# Patient Record
Sex: Female | Born: 1980 | Race: White | Hispanic: No | Marital: Married | Smoking: Never smoker
Health system: Southern US, Community
[De-identification: ages and names within clinical notes are randomized; demographics above are authoritative.]

## PROBLEM LIST (undated history)

## (undated) DIAGNOSIS — Z789 Other specified health status: Secondary | ICD-10-CM

## (undated) DIAGNOSIS — E119 Type 2 diabetes mellitus without complications: Secondary | ICD-10-CM

## (undated) DIAGNOSIS — D62 Acute posthemorrhagic anemia: Secondary | ICD-10-CM

## (undated) HISTORY — DX: Type 2 diabetes mellitus without complications: E11.9

## (undated) HISTORY — PX: WISDOM TOOTH EXTRACTION: SHX21

## (undated) HISTORY — PX: TOTAL HIP ARTHROPLASTY: SHX124

## (undated) HISTORY — PX: REFRACTIVE SURGERY: SHX103

## (undated) HISTORY — PX: HAND SURGERY: SHX662

---

## 2010-03-11 ENCOUNTER — Ambulatory Visit (HOSPITAL_BASED_OUTPATIENT_CLINIC_OR_DEPARTMENT_OTHER): Admission: RE | Admit: 2010-03-11 | Discharge: 2010-03-11 | Payer: Self-pay | Admitting: Orthopedic Surgery

## 2012-06-27 NOTE — L&D Delivery Note (Signed)
Delivery Note After a protracted 1st stage of labor patient with OP position that turned OA with exaggerated Sims and pushing in stage 2 in lateral position, at 2:35 AM a viable and healthy female was delivered via Vaginal, Spontaneous Delivery (Presentation: ; Occiput Anterior).  APGAR: 9, 9; weight . pending  Placenta status: Intact, Spontaneous but took longer than 30 minutes to separate.  Cord: 3 vessels with the following complications: None.  Cord pH: N/A  Anesthesia: Epidural  Episiotomy: None Lacerations: Periurethral Suture Repair: 3.0 vicryl rapide Est. Blood Loss (mL): 350 cc but lower segment boggy (with prior PPHage hx) Cytotec was placed rectally.   Mom to postpartum.  Baby to nursery-stable.  Sangita Zani R 04/02/2013, 3:36 AM

## 2012-09-05 LAB — OB RESULTS CONSOLE RPR: RPR: NONREACTIVE

## 2012-09-05 LAB — OB RESULTS CONSOLE HIV ANTIBODY (ROUTINE TESTING): HIV: NONREACTIVE

## 2012-09-05 LAB — OB RESULTS CONSOLE ABO/RH: RH Type: POSITIVE

## 2012-09-18 LAB — OB RESULTS CONSOLE GC/CHLAMYDIA
Chlamydia: NEGATIVE
Gonorrhea: NEGATIVE

## 2013-03-13 LAB — OB RESULTS CONSOLE GBS: GBS: NEGATIVE

## 2013-04-01 ENCOUNTER — Encounter (HOSPITAL_COMMUNITY): Payer: Self-pay | Admitting: Anesthesiology

## 2013-04-01 ENCOUNTER — Inpatient Hospital Stay (HOSPITAL_COMMUNITY): Payer: 59 | Admitting: Anesthesiology

## 2013-04-01 ENCOUNTER — Inpatient Hospital Stay (HOSPITAL_COMMUNITY)
Admission: AD | Admit: 2013-04-01 | Discharge: 2013-04-03 | DRG: 775 | Disposition: A | Discharge status: Home / Self Care | Payer: 59 | Source: Ambulatory Visit | Attending: Obstetrics & Gynecology | Admitting: Obstetrics & Gynecology

## 2013-04-01 ENCOUNTER — Encounter (HOSPITAL_COMMUNITY): Payer: Self-pay | Admitting: *Deleted

## 2013-04-01 DIAGNOSIS — D62 Acute posthemorrhagic anemia: Secondary | ICD-10-CM

## 2013-04-01 DIAGNOSIS — IMO0001 Reserved for inherently not codable concepts without codable children: Secondary | ICD-10-CM

## 2013-04-01 DIAGNOSIS — O9903 Anemia complicating the puerperium: Secondary | ICD-10-CM | POA: Diagnosis not present

## 2013-04-01 HISTORY — DX: Other specified health status: Z78.9

## 2013-04-01 HISTORY — DX: Acute posthemorrhagic anemia: D62

## 2013-04-01 LAB — CBC
MCH: 26.3 pg (ref 26.0–34.0)
MCV: 80.7 fL (ref 78.0–100.0)
Platelets: 343 10*3/uL (ref 150–400)
RBC: 4.3 MIL/uL (ref 3.87–5.11)
RDW: 14.6 % (ref 11.5–15.5)
WBC: 10.6 10*3/uL — ABNORMAL HIGH (ref 4.0–10.5)

## 2013-04-01 MED ORDER — ACETAMINOPHEN 325 MG PO TABS: 650.0000 mg | ORAL_TABLET | ORAL | Status: DC | PRN

## 2013-04-01 MED ORDER — LACTATED RINGERS IV SOLN
INTRAVENOUS | Status: DC
  Administered 2013-04-02: 125 mL/h via INTRAVENOUS

## 2013-04-01 MED ORDER — LACTATED RINGERS IV SOLN: 500.0000 mL | INTRAVENOUS | Status: DC | PRN

## 2013-04-01 MED ORDER — EPHEDRINE 5 MG/ML INJ
10.0000 mg | INTRAVENOUS | Status: DC | PRN
  Filled 2013-04-01: qty 4
  Filled 2013-04-01: qty 2

## 2013-04-01 MED ORDER — CITRIC ACID-SODIUM CITRATE 334-500 MG/5ML PO SOLN: 30.0000 mL | ORAL | Status: DC | PRN

## 2013-04-01 MED ORDER — BUTORPHANOL TARTRATE 1 MG/ML IJ SOLN: 1.0000 mg | INTRAMUSCULAR | Status: DC | PRN

## 2013-04-01 MED ORDER — IBUPROFEN 600 MG PO TABS: 600.0000 mg | ORAL_TABLET | Freq: Four times a day (QID) | ORAL | Status: DC | PRN

## 2013-04-01 MED ORDER — BUTORPHANOL TARTRATE 1 MG/ML IJ SOLN
INTRAMUSCULAR | Status: AC
  Administered 2013-04-01: 1 mg
  Filled 2013-04-01: qty 1

## 2013-04-01 MED ORDER — ONDANSETRON HCL 4 MG/2ML IJ SOLN: 4.0000 mg | Freq: Four times a day (QID) | INTRAMUSCULAR | Status: DC | PRN

## 2013-04-01 MED ORDER — PHENYLEPHRINE 40 MCG/ML (10ML) SYRINGE FOR IV PUSH (FOR BLOOD PRESSURE SUPPORT)
80.0000 ug | PREFILLED_SYRINGE | INTRAVENOUS | Status: DC | PRN
  Filled 2013-04-01: qty 2

## 2013-04-01 MED ORDER — OXYTOCIN 40 UNITS IN LACTATED RINGERS INFUSION - SIMPLE MED
62.5000 mL/h | INTRAVENOUS | Status: DC
  Filled 2013-04-01: qty 1000

## 2013-04-01 MED ORDER — DIPHENHYDRAMINE HCL 50 MG/ML IJ SOLN: 12.5000 mg | INTRAMUSCULAR | Status: DC | PRN

## 2013-04-01 MED ORDER — OXYTOCIN BOLUS FROM INFUSION
500.0000 mL | INTRAVENOUS | Status: DC
  Administered 2013-04-02: 500 mL via INTRAVENOUS

## 2013-04-01 MED ORDER — EPHEDRINE 5 MG/ML INJ
10.0000 mg | INTRAVENOUS | Status: DC | PRN
  Filled 2013-04-01: qty 2

## 2013-04-01 MED ORDER — OXYCODONE-ACETAMINOPHEN 5-325 MG PO TABS: 1.0000 | ORAL_TABLET | ORAL | Status: DC | PRN

## 2013-04-01 MED ORDER — FENTANYL 2.5 MCG/ML BUPIVACAINE 1/10 % EPIDURAL INFUSION (WH - ANES)
14.0000 mL/h | INTRAMUSCULAR | Status: DC | PRN
  Administered 2013-04-01 (×2): 14 mL/h via EPIDURAL
  Filled 2013-04-01: qty 125

## 2013-04-01 MED ORDER — LACTATED RINGERS IV SOLN
500.0000 mL | Freq: Once | INTRAVENOUS | Status: AC
  Administered 2013-04-01: 500 mL via INTRAVENOUS

## 2013-04-01 MED ORDER — LIDOCAINE HCL (PF) 1 % IJ SOLN
30.0000 mL | INTRAMUSCULAR | Status: DC | PRN
  Administered 2013-04-02: 30 mL via SUBCUTANEOUS
  Filled 2013-04-01 (×2): qty 30

## 2013-04-01 MED ORDER — LIDOCAINE HCL (PF) 1 % IJ SOLN
INTRAMUSCULAR | Status: DC | PRN
  Administered 2013-04-01 (×2): 5 mL

## 2013-04-01 MED ORDER — PHENYLEPHRINE 40 MCG/ML (10ML) SYRINGE FOR IV PUSH (FOR BLOOD PRESSURE SUPPORT)
80.0000 ug | PREFILLED_SYRINGE | INTRAVENOUS | Status: DC | PRN
  Filled 2013-04-01: qty 2
  Filled 2013-04-01: qty 5

## 2013-04-01 NOTE — Anesthesia Procedure Notes (Signed)
Epidural Patient location during procedure: OB Start time: 04/01/2013 9:54 PM  Staffing Anesthesiologist: Angus Seller., Harrell Gave. Performed by: anesthesiologist   Preanesthetic Checklist Completed: patient identified, site marked, surgical consent, pre-op evaluation, timeout performed, IV checked, risks and benefits discussed and monitors and equipment checked  Epidural Patient position: sitting Prep: site prepped and draped and DuraPrep Patient monitoring: continuous pulse ox and blood pressure Approach: midline Injection technique: LOR air and LOR saline  Needle:  Needle type: Tuohy  Needle gauge: 17 G Needle length: 9 cm and 9 Needle insertion depth: 5 cm cm Catheter type: closed end flexible Catheter size: 19 Gauge Catheter at skin depth: 10 cm Test dose: negative  Assessment Events: blood not aspirated, injection not painful, no injection resistance, negative IV test and no paresthesia  Additional Notes Patient identified.  Risk benefits discussed including failed block, incomplete pain control, headache, nerve damage, paralysis, blood pressure changes, nausea, vomiting, reactions to medication both toxic or allergic, and postpartum back pain.  Patient expressed understanding and wished to proceed.  All questions were answered.  Sterile technique used throughout procedure and epidural site dressed with sterile barrier dressing. No paresthesia or other complications noted.The patient did not experience any signs of intravascular injection such as tinnitus or metallic taste in mouth nor signs of intrathecal spread such as rapid motor block. Please see nursing notes for vital signs.

## 2013-04-01 NOTE — Progress Notes (Signed)
Patient ID: Karina Pollard, female   DOB: 1980/10/24, 32 y.o.   MRN: [id number] Subjective: Requesting epidural since Stadol not helping and progress arrested at 9 cm, possible OP.   Objective: BP 102/66  Pulse 101  Temp(Src) 98.3 F (36.8 C) (Oral)  Resp 18  Ht 5\' 2"  (1.575 m)  Wt 157 lb (71.215 kg)  BMI 28.71 kg/m2  FHT:  FHR: 140 bpm, variability: moderate,  accelerations:  Present,  decelerations:  Absent UC:   regular, every 3-5 minutes spontaneous labor SVE:   Dilation: Lip/rim Effacement (%): 90 Station: 0;+1 Exam by:: [email or web address]   Assessment / Plan: Spontaneous labor, progressing normally with arrest of rotation/dilatation, give epidural, put in exaggerated Sims position and reassess progress.   Fetal Wellbeing:  Category I Pain Control:  Epidural in process  Anticipated MOD:  NSVD  [name] 04/01/2013, 10:02 PM

## 2013-04-01 NOTE — Anesthesia Preprocedure Evaluation (Signed)

## 2013-04-01 NOTE — H&P (Signed)
Karina Pollard is a 32 y.o. female presenting at 38.1 wks in active labor, direct admit from office.  No leaking/bleeding. UCs since this afternoon.  [facility], Dr Seymour Bars primary. Nl labs and anatomy sono. Uncomplicated pregnancy.  Prior term SVD at 37 wks, was OP delivery History OB History   Grav Para Term Preterm Abortions TAB SAB Ect Mult Living   2 1 1  0 0 0 0 0 0 1     Past Medical History  Diagnosis Date  . Medical history non-contributory    Past Surgical History  Procedure Laterality Date  . Wisdom tooth extraction     Family History: family history is not on file. Social History:  reports that she has never smoked. She has never used smokeless tobacco. She reports that she does not drink alcohol or use illicit drugs.   Prenatal Transfer Tool  Maternal Diabetes: No Genetic Screening: declined, but AFP1 normal  Maternal Ultrasounds/Referrals: Normal Fetal Ultrasounds or other Referrals:  None Maternal Substance Abuse:  No Significant Maternal Medications:  None Significant Maternal Lab Results:  Lab values include: Group B Strep negative Other Comments:  None  ROS neg  Dilation: 5.5 Effacement (%): 90 Exam by:: Dr. Juliene Pina Blood pressure 116/81, pulse 97, temperature 98.1 F (36.7 C), temperature source Axillary, resp. rate 20, height 5\' 2"  (1.575 m), weight 157 lb (71.215 kg). Exam Physical Exam  Physical exam:  A&O x 3, no acute distress. Pleasant HEENT neg, no thyromegaly Lungs CTA bilat CV RRR, S1S2 normal Abdo soft, non tender, non acute, gravid uterus. Relaxing in b/w UCs.  Extr no edema/ tenderness Pelvic as above. AROM, copious clear fluid, head well applied to cervix after AROM.  FHT 130s/ + accels/ no decels/ mod variab- reassuring Toco q 4 min, spontaneous  Prenatal labs: ABO, Rh: A(+) Antibody: neg Rubella: immune RPR: neg x2 HBsAg: neg HIV: neg GBS: neg 3hr GTT- one abn value  Assessment/Plan: 32 yo, G2 P1 at 38.1 wks in  active labor, EFW 7 lbs. FHT category I, GBS neg. Pt plans to defer epidural and IV pain meds, will cont to support as needed, anticipate SVD.    Deundra Bard R 04/01/2013, 4:33 PM

## 2013-04-02 ENCOUNTER — Encounter (HOSPITAL_COMMUNITY): Payer: Self-pay | Admitting: Emergency Medicine

## 2013-04-02 LAB — CBC
Hemoglobin: 9.2 g/dL — ABNORMAL LOW (ref 12.0–15.0)
MCH: 26.7 pg (ref 26.0–34.0)
MCHC: 33 g/dL (ref 30.0–36.0)
RDW: 14.5 % (ref 11.5–15.5)
WBC: 12.4 10*3/uL — ABNORMAL HIGH (ref 4.0–10.5)

## 2013-04-02 MED ORDER — MISOPROSTOL 200 MCG PO TABS
800.0000 ug | ORAL_TABLET | Freq: Once | ORAL | Status: AC
  Administered 2013-04-02: 800 ug via RECTAL

## 2013-04-02 MED ORDER — ONDANSETRON HCL 4 MG PO TABS: 4.0000 mg | ORAL_TABLET | ORAL | Status: DC | PRN

## 2013-04-02 MED ORDER — SIMETHICONE 80 MG PO CHEW: 80.0000 mg | CHEWABLE_TABLET | ORAL | Status: DC | PRN

## 2013-04-02 MED ORDER — IBUPROFEN 600 MG PO TABS
600.0000 mg | ORAL_TABLET | Freq: Four times a day (QID) | ORAL | Status: DC
  Administered 2013-04-02 – 2013-04-03 (×5): 600 mg via ORAL
  Filled 2013-04-02 (×5): qty 1

## 2013-04-02 MED ORDER — SENNOSIDES-DOCUSATE SODIUM 8.6-50 MG PO TABS
2.0000 | ORAL_TABLET | ORAL | Status: DC
  Administered 2013-04-02: 2 via ORAL

## 2013-04-02 MED ORDER — PRENATAL MULTIVITAMIN CH
1.0000 | ORAL_TABLET | Freq: Every day | ORAL | Status: DC
  Administered 2013-04-02: 1 via ORAL
  Filled 2013-04-02: qty 1

## 2013-04-02 MED ORDER — MISOPROSTOL 200 MCG PO TABS
ORAL_TABLET | ORAL | Status: AC
  Administered 2013-04-02: 800 ug via RECTAL
  Filled 2013-04-02: qty 4

## 2013-04-02 MED ORDER — TETANUS-DIPHTH-ACELL PERTUSSIS 5-2.5-18.5 LF-MCG/0.5 IM SUSP: 0.5000 mL | Freq: Once | INTRAMUSCULAR | Status: DC

## 2013-04-02 MED ORDER — LANOLIN HYDROUS EX OINT: TOPICAL_OINTMENT | CUTANEOUS | Status: DC | PRN

## 2013-04-02 MED ORDER — ONDANSETRON HCL 4 MG/2ML IJ SOLN: 4.0000 mg | INTRAMUSCULAR | Status: DC | PRN

## 2013-04-02 MED ORDER — WITCH HAZEL-GLYCERIN EX PADS: 1.0000 "application " | MEDICATED_PAD | CUTANEOUS | Status: DC | PRN

## 2013-04-02 MED ORDER — DIPHENHYDRAMINE HCL 25 MG PO CAPS: 25.0000 mg | ORAL_CAPSULE | Freq: Four times a day (QID) | ORAL | Status: DC | PRN

## 2013-04-02 MED ORDER — BENZOCAINE-MENTHOL 20-0.5 % EX AERO: 1.0000 "application " | INHALATION_SPRAY | CUTANEOUS | Status: DC | PRN

## 2013-04-02 MED ORDER — DIBUCAINE 1 % RE OINT: 1.0000 "application " | TOPICAL_OINTMENT | RECTAL | Status: DC | PRN

## 2013-04-02 MED ORDER — ZOLPIDEM TARTRATE 5 MG PO TABS: 5.0000 mg | ORAL_TABLET | Freq: Every evening | ORAL | Status: DC | PRN

## 2013-04-02 MED ORDER — OXYCODONE-ACETAMINOPHEN 5-325 MG PO TABS: 1.0000 | ORAL_TABLET | ORAL | Status: DC | PRN

## 2013-04-02 NOTE — Progress Notes (Signed)
Karina Pollard is a 32 y.o. G2P1001 at [redacted]w[redacted]d in active labor. S/p Stadol and now s/p Epidural with protracted 1st stage.   Objective: BP 117/70  Pulse 86  Temp(Src) 99 F (37.2 C) (Oral)  Resp 18  Ht 5\' 2"  (1.575 m)  Wt 157 lb (71.215 kg)  BMI 28.71 kg/m2   FHT:  FHR: 135 bpm, variability: moderate,  accelerations:  Present,  decelerations:  Absent UC:   regular, every 3 minutes per palpation per RN SVE:   Dilation: 10 Effacement (%): 90 Station: 0 Exam by:: Dr Juliene Pina Place in right exaggerated Sims, reassess in 1 hr for rotation and descent  Assessment / Plan: Protracted active phase, but now complete. Reassess in 1 hr.  Labor: Progressing normally and will continue to assess for descent Fetal Wellbeing:  Category I Pain Control:  Epidural Anticipated MOD:  NSVD  Karina Pollard R 04/02/2013, 12:29 AM

## 2013-04-02 NOTE — Anesthesia Postprocedure Evaluation (Signed)
  Anesthesia Post-op Note  Anesthesia Post Note  Patient: Karina Pollard  Procedure(s) Performed: * No procedures listed *  Anesthesia type: Epidural  Patient location: Mother/Baby  Post pain: Pain level controlled  Post assessment: Post-op Vital signs reviewed  Last Vitals:  Filed Vitals:   04/02/13 0545  BP: 110/87  Pulse: 92  Temp: 37.1 C  Resp: 20    Post vital signs: Reviewed  Level of consciousness:alert  Complications: No apparent anesthesia complications

## 2013-04-02 NOTE — Progress Notes (Signed)
Iv infiltrated, iv d/c'd, very large area of swelling at site, non-tender, and no redness

## 2013-04-03 ENCOUNTER — Encounter (HOSPITAL_COMMUNITY): Payer: Self-pay | Admitting: Obstetrics and Gynecology

## 2013-04-03 DIAGNOSIS — D62 Acute posthemorrhagic anemia: Secondary | ICD-10-CM

## 2013-04-03 HISTORY — DX: Acute posthemorrhagic anemia: D62

## 2013-04-03 MED ORDER — POLYSACCHARIDE IRON COMPLEX 150 MG PO CAPS
150.0000 mg | ORAL_CAPSULE | Freq: Two times a day (BID) | ORAL | Status: DC
  Filled 2013-04-03: qty 1

## 2013-04-03 MED ORDER — FERROUS SULFATE DRIED ER 160 (50 FE) MG PO TBCR: 1.0000 | EXTENDED_RELEASE_TABLET | Freq: Two times a day (BID) | ORAL | Status: DC

## 2013-04-03 MED ORDER — IBUPROFEN 600 MG PO TABS: 600.0000 mg | ORAL_TABLET | Freq: Four times a day (QID) | ORAL | Status: DC

## 2013-04-03 NOTE — Discharge Summary (Signed)
Obstetric Discharge Summary Reason for Admission: onset of labor Prenatal Procedures: ultrasound Intrapartum Procedures: spontaneous vaginal delivery Postpartum Procedures: none Complications-Operative and Postpartum: periurethral laceration Hemoglobin  Date Value Range Status  04/02/2013 9.2* 12.0 - 15.0 g/dL Final     DELTA CHECK NOTED     REPEATED TO VERIFY     HCT  Date Value Range Status  04/02/2013 27.9* 36.0 - 46.0 % Final    Physical Exam:  General: alert, cooperative and no distress Lochia: appropriate Uterine Fundus: firm DVT Evaluation: No evidence of DVT seen on physical exam. Negative Homan's sign. No cords or calf tenderness. No significant calf/ankle edema.  Discharge Diagnoses: Term Pregnancy-delivered        Acute Blood Loss Anemia Discharge Information: Date: 04/03/2013 Activity: pelvic rest Diet: routine Medications: PNV, Ibuprofen and Iron Condition: stable Instructions: refer to practice specific booklet Discharge to: home Follow-up Information   Follow up with LAVOIE,MARIE-LYNE, MD. Schedule an appointment as soon as possible for a visit in 6 weeks.   Specialty:  Obstetrics and Gynecology   Contact information:   [address] [place] [address] [phone]       Newborn Data: Live born female on 04/02/2013 Birth Weight: 7 lb 8.6 oz (3420 g) APGAR: 9, 9  Infant remains in NICU - possible d/c this Friday  Raelyn Mora, M, MSN, CNM 04/03/2013, 9:50 AM

## 2013-04-03 NOTE — Progress Notes (Signed)
Patient ID: Karina Pollard, female   DOB: 1980/12/26, 32 y.o.   MRN: [id number] PPD # 1 SVD  S:  Reports feeling well, desires early discharge d/t other child at home, baby possibly d/c'd this Friday             Tolerating po/ No nausea or vomiting             Bleeding is light             Pain controlled with none, Rx'd ibuprofen             Up ad lib / ambulatory / voiding without difficulties    Newborn  Information for the patient's newborn:  Karina, Pollard [[id number]]  female  bottle feeding - baby in NICU / Circumcision planning prior to infant's discharge   O:  A & O x 3, in no apparent distress, very pleasant             VS:  Filed Vitals:   04/02/13 0445 04/02/13 0545 04/02/13 1100 04/02/13 1812  BP: 116/72 110/87 111/76 122/74  Pulse: 98 92 82 95  Temp: 98.5 F (36.9 C) 98.7 F (37.1 C) 98.1 F (36.7 C) 97.7 F (36.5 C)  TempSrc: Oral Oral Oral Oral  Resp: 20 20 16 18   Height:      Weight:      SpO2: 99% 100% 98% 99%    LABS:  Recent Labs  04/01/13 1540 04/02/13 0600  WBC 10.6* 12.4*  HGB 11.3* 9.2*  HCT 34.7* 27.9*  PLT 343 258    Blood type: A/Positive/-- (03/12 0000)  Rubella: Immune (03/12 0000)     Lungs: Clear and unlabored  Heart: regular rate and rhythm / no murmurs  Abdomen: soft, non-tender, non-distended, normal bowel sounds             Fundus: firm, non-tender, U-2  Perineum: repair healing well, edema present - no ice pack in place  Lochia: light  Extremities: no edema, no calf pain or tenderness, no Homans    A/P: PPD # 1  32 y.o., [id number]   Active Problems:   Postpartum care following vaginal delivery (10/7)   Acute blood loss anemia   Doing well - stable status  Routine post partum orders  Niferex 150mg  BID x 6 weeks  Early discharge today  Karina Pollard, M, MSN, CNM 04/03/2013, 9:42 AM

## 2013-04-09 ENCOUNTER — Inpatient Hospital Stay (HOSPITAL_COMMUNITY): Admission: RE | Admit: 2013-04-09 | Payer: 59 | Source: Ambulatory Visit

## 2014-04-28 ENCOUNTER — Encounter (HOSPITAL_COMMUNITY): Payer: Self-pay | Admitting: Obstetrics and Gynecology

## 2016-08-08 ENCOUNTER — Encounter: Payer: Self-pay | Admitting: Obstetrics & Gynecology

## 2016-08-08 DIAGNOSIS — Z01419 Encounter for gynecological examination (general) (routine) without abnormal findings: Secondary | ICD-10-CM | POA: Diagnosis not present

## 2016-08-08 DIAGNOSIS — Z6824 Body mass index (BMI) 24.0-24.9, adult: Secondary | ICD-10-CM | POA: Diagnosis not present

## 2016-09-09 DIAGNOSIS — J069 Acute upper respiratory infection, unspecified: Secondary | ICD-10-CM | POA: Diagnosis not present

## 2016-11-24 DIAGNOSIS — J0100 Acute maxillary sinusitis, unspecified: Secondary | ICD-10-CM | POA: Diagnosis not present

## 2017-03-06 DIAGNOSIS — M545 Low back pain: Secondary | ICD-10-CM | POA: Diagnosis not present

## 2017-03-30 DIAGNOSIS — J0100 Acute maxillary sinusitis, unspecified: Secondary | ICD-10-CM | POA: Diagnosis not present

## 2017-05-24 DIAGNOSIS — M545 Low back pain: Secondary | ICD-10-CM | POA: Diagnosis not present

## 2017-05-24 DIAGNOSIS — S336XXA Sprain of sacroiliac joint, initial encounter: Secondary | ICD-10-CM | POA: Diagnosis not present

## 2017-06-14 DIAGNOSIS — L820 Inflamed seborrheic keratosis: Secondary | ICD-10-CM | POA: Diagnosis not present

## 2017-06-14 DIAGNOSIS — L578 Other skin changes due to chronic exposure to nonionizing radiation: Secondary | ICD-10-CM | POA: Diagnosis not present

## 2017-06-14 DIAGNOSIS — L821 Other seborrheic keratosis: Secondary | ICD-10-CM | POA: Diagnosis not present

## 2017-09-02 DIAGNOSIS — K529 Noninfective gastroenteritis and colitis, unspecified: Secondary | ICD-10-CM | POA: Diagnosis not present

## 2017-09-21 ENCOUNTER — Telehealth: Payer: Self-pay | Admitting: *Deleted

## 2017-09-21 MED ORDER — NORETHIN ACE-ETH ESTRAD-FE 1-20 MG-MCG(24) PO TABS: 1.0000 | ORAL_TABLET | Freq: Every day | ORAL | 3 refills | Status: DC

## 2017-09-21 NOTE — Telephone Encounter (Signed)
Pt has annual scheduled on 01/02/18 needs refill on birth control pills, per [facility] chart,last annual 08/08/2016,Rx for lomedia 24 FE sent.

## 2017-11-29 DIAGNOSIS — M25562 Pain in left knee: Secondary | ICD-10-CM | POA: Diagnosis not present

## 2017-12-01 DIAGNOSIS — M25562 Pain in left knee: Secondary | ICD-10-CM | POA: Diagnosis not present

## 2017-12-04 ENCOUNTER — Telehealth: Payer: Self-pay | Admitting: *Deleted

## 2017-12-04 MED ORDER — NORETHIN ACE-ETH ESTRAD-FE 1-20 MG-MCG(24) PO TABS: ORAL_TABLET | ORAL | 0 refills | Status: DC

## 2017-12-04 NOTE — Telephone Encounter (Signed)
Patient called stating her Rx that was sent on 09/21/17 was written incorrect. States she takes 1 active pill continuously skipping placebo pills. I called the pharmacy [facility] in [place] and confirmed and this was correct, new Rx will be sent with previous directions.

## 2018-01-01 DIAGNOSIS — L821 Other seborrheic keratosis: Secondary | ICD-10-CM | POA: Diagnosis not present

## 2018-01-01 DIAGNOSIS — C44519 Basal cell carcinoma of skin of other part of trunk: Secondary | ICD-10-CM | POA: Diagnosis not present

## 2018-01-01 DIAGNOSIS — L578 Other skin changes due to chronic exposure to nonionizing radiation: Secondary | ICD-10-CM | POA: Diagnosis not present

## 2018-01-01 DIAGNOSIS — L570 Actinic keratosis: Secondary | ICD-10-CM | POA: Diagnosis not present

## 2018-01-02 ENCOUNTER — Encounter: Payer: Self-pay | Admitting: Obstetrics & Gynecology

## 2018-01-29 DIAGNOSIS — C44529 Squamous cell carcinoma of skin of other part of trunk: Secondary | ICD-10-CM | POA: Diagnosis not present

## 2018-02-06 DIAGNOSIS — M25562 Pain in left knee: Secondary | ICD-10-CM | POA: Diagnosis not present

## 2018-02-08 DIAGNOSIS — M94262 Chondromalacia, left knee: Secondary | ICD-10-CM | POA: Diagnosis not present

## 2018-02-08 DIAGNOSIS — M25562 Pain in left knee: Secondary | ICD-10-CM | POA: Diagnosis not present

## 2018-02-14 DIAGNOSIS — M25562 Pain in left knee: Secondary | ICD-10-CM | POA: Diagnosis not present

## 2018-02-19 DIAGNOSIS — M1712 Unilateral primary osteoarthritis, left knee: Secondary | ICD-10-CM | POA: Diagnosis not present

## 2018-02-23 ENCOUNTER — Other Ambulatory Visit: Payer: Self-pay | Admitting: Obstetrics & Gynecology

## 2018-02-27 DIAGNOSIS — M1712 Unilateral primary osteoarthritis, left knee: Secondary | ICD-10-CM | POA: Diagnosis not present

## 2018-03-06 DIAGNOSIS — M1712 Unilateral primary osteoarthritis, left knee: Secondary | ICD-10-CM | POA: Diagnosis not present

## 2018-04-04 ENCOUNTER — Encounter: Payer: Self-pay | Admitting: Obstetrics & Gynecology

## 2018-04-05 ENCOUNTER — Encounter: Payer: Self-pay | Admitting: Obstetrics & Gynecology

## 2018-04-17 DIAGNOSIS — M1712 Unilateral primary osteoarthritis, left knee: Secondary | ICD-10-CM | POA: Diagnosis not present

## 2018-05-04 ENCOUNTER — Encounter: Payer: Self-pay | Admitting: Obstetrics & Gynecology

## 2018-05-04 ENCOUNTER — Ambulatory Visit (INDEPENDENT_AMBULATORY_CARE_PROVIDER_SITE_OTHER): Payer: 59 | Admitting: Obstetrics & Gynecology

## 2018-05-04 VITALS — BP 128/76 | Ht 62.5 in | Wt 140.0 lb

## 2018-05-04 DIAGNOSIS — Z01419 Encounter for gynecological examination (general) (routine) without abnormal findings: Secondary | ICD-10-CM

## 2018-05-04 DIAGNOSIS — Z3041 Encounter for surveillance of contraceptive pills: Secondary | ICD-10-CM | POA: Diagnosis not present

## 2018-05-04 MED ORDER — NORETHIN ACE-ETH ESTRAD-FE 1-20 MG-MCG PO TABS: 1.0000 | ORAL_TABLET | Freq: Every day | ORAL | 4 refills | Status: DC

## 2018-05-04 NOTE — Patient Instructions (Signed)
1. Well female exam with routine gynecological exam Normal gynecologic exam.  Pap test was negative with negative high-risk HPV in February 2018.  Will repeat Pap test at 2 to 3 years.  Breast exam normal.  Body mass index good at 25.2.  Will follow up here for fasting health labs. - CBC; Future - Comp Met (CMET); Future - Lipid panel; Future - TSH; Future - VITAMIN D 25 Hydroxy (Vit-D Deficiency, Fractures); Future  2. Encounter for surveillance of contraceptive pills Well on birth control pills continuous use for migraines.  No contraindication to birth control pills.  Blisovi was too expensive, therefore new birth control pill prescribed.  Prescription sent for Janel FE 1/20.  Other orders - norethindrone-ethinyl estradiol (JUNEL FE,GILDESS FE,LOESTRIN FE) 1-20 MG-MCG tablet; Take 1 tablet by mouth daily. Continuous use.  Analaya, it was a pleasure seeing you today!  I will inform you of your lab results as soon as they are available.

## 2018-05-04 NOTE — Progress Notes (Signed)
Karina Pollard [birth year] [id number]   History:    37 y.o. G2P2L2 Married.  Daughter 85, son 14 yo  RP:  Established patient presenting for annual gyn exam   HPI: Well on Blisovi 24 FE 1/20 continuous use for contraception and migraines.  Patient would like to change pill because of cost issues.  No breakthrough bleeding.  No pelvic pain.  No pain with intercourse.  Urine and bowel movements normal.  Breasts normal. BMI 25.2.  Physically active playing with her kids.  Will follow up here with fasting health labs.  Past medical history,surgical history, family history and social history were all reviewed and documented in the EPIC chart.  Gynecologic History No LMP recorded. (Menstrual status: Oral contraceptives). Contraception: OCP (estrogen/progesterone) Last Pap: 07/2016. Results were: Negative/HPV HR neg Last mammogram: Never Bone Density: Never Colonoscopy: Never  Obstetric History OB History  Gravida Para Term Preterm AB Living  2 2 2  0 0 2  SAB TAB Ectopic Multiple Live Births  0 0 0 0 2    # Outcome Date GA Lbr Len/2nd Weight Sex Delivery Anes PTL Lv  2 Term 04/02/13 75w2d11:45 / 02:20 7 lb 8.6 oz (3.42 kg) M Vag-Spont EPI  LIV  1 Term     F Vag-Spont None N LIV     ROS: A ROS was performed and pertinent positives and negatives are included in the history.  GENERAL: No fevers or chills. HEENT: No change in vision, no earache, sore throat or sinus congestion. NECK: No pain or stiffness. CARDIOVASCULAR: No chest pain or pressure. No palpitations. PULMONARY: No shortness of breath, cough or wheeze. GASTROINTESTINAL: No abdominal pain, nausea, vomiting or diarrhea, melena or bright red blood per rectum. GENITOURINARY: No urinary frequency, urgency, hesitancy or dysuria. MUSCULOSKELETAL: No joint or muscle pain, no back pain, no recent trauma. DERMATOLOGIC: No rash, no itching, no lesions. ENDOCRINE: No polyuria, polydipsia, no heat or cold intolerance. No recent change in  weight. HEMATOLOGICAL: No anemia or easy bruising or bleeding. NEUROLOGIC: No headache, seizures, numbness, tingling or weakness. PSYCHIATRIC: No depression, no loss of interest in normal activity or change in sleep pattern.     Exam:   BP 128/76   There is no height or weight on file to calculate BMI.  General appearance : Well developed well nourished female. No acute distress HEENT: Eyes: no retinal hemorrhage or exudates,  Neck supple, trachea midline, no carotid bruits, no thyroidmegaly Lungs: Clear to auscultation, no rhonchi or wheezes, or rib retractions  Heart: Regular rate and rhythm, no murmurs or gallops Breast:Examined in sitting and supine position were symmetrical in appearance, no palpable masses or tenderness,  no skin retraction, no nipple inversion, no nipple discharge, no skin discoloration, no axillary or supraclavicular lymphadenopathy Abdomen: no palpable masses or tenderness, no rebound or guarding Extremities: no edema or skin discoloration or tenderness  Pelvic: Vulva: Normal             Vagina: No gross lesions or discharge  Cervix: No gross lesions or discharge  Uterus  AV, normal size, shape and consistency, non-tender and mobile  Adnexa  Without masses or tenderness  Anus: Normal   Assessment/Plan:  37y.o. female for annual exam   1. Well female exam with routine gynecological exam Normal gynecologic exam.  Pap test was negative with negative high-risk HPV in February 2018.  Will repeat Pap test at 2 to 3 years.  Breast exam normal.  Body mass index good at 25.2.  Will follow up here for fasting health labs. - CBC; Future - Comp Met (CMET); Future - Lipid panel; Future - TSH; Future - VITAMIN D 25 Hydroxy (Vit-D Deficiency, Fractures); Future  2. Encounter for surveillance of contraceptive pills Well on birth control pills continuous use for migraines.  No contraindication to birth control pills.  Blisovi was too expensive, therefore new birth  control pill prescribed.  Prescription sent for Janel FE 1/20.  Other orders - norethindrone-ethinyl estradiol (JUNEL FE,GILDESS FE,LOESTRIN FE) 1-20 MG-MCG tablet; Take 1 tablet by mouth daily. Continuous use.  Princess Bruins MD, 12:31 PM 05/04/2018

## 2018-06-27 HISTORY — PX: HIP SURGERY: SHX245

## 2019-05-06 ENCOUNTER — Other Ambulatory Visit: Payer: Self-pay

## 2019-05-07 ENCOUNTER — Encounter: Payer: Self-pay | Admitting: Obstetrics & Gynecology

## 2019-05-07 ENCOUNTER — Ambulatory Visit (INDEPENDENT_AMBULATORY_CARE_PROVIDER_SITE_OTHER): Payer: 59 | Admitting: Obstetrics & Gynecology

## 2019-05-07 VITALS — BP 120/70 | Ht 62.5 in | Wt 141.0 lb

## 2019-05-07 DIAGNOSIS — N393 Stress incontinence (female) (male): Secondary | ICD-10-CM | POA: Diagnosis not present

## 2019-05-07 DIAGNOSIS — Z01419 Encounter for gynecological examination (general) (routine) without abnormal findings: Secondary | ICD-10-CM | POA: Diagnosis not present

## 2019-05-07 DIAGNOSIS — Z3041 Encounter for surveillance of contraceptive pills: Secondary | ICD-10-CM | POA: Diagnosis not present

## 2019-05-07 MED ORDER — NORETHIN ACE-ETH ESTRAD-FE 1-20 MG-MCG PO TABS: 1.0000 | ORAL_TABLET | Freq: Every day | ORAL | 4 refills | Status: DC

## 2019-05-07 NOTE — Patient Instructions (Signed)
1. Encounter for routine gynecological examination with Papanicolaou smear of cervix Normal gynecologic exam.  Pap reflex done.  Breast exam normal.  Will start screening mammogram at age 38 next year.  Good body mass index at 25.38.  Continue with fitness and healthy nutrition.  Fasting health labs here today. - CBC - Comp Met (CMET) - TSH - Lipid panel - VITAMIN D 25 Hydroxy (Vit-D Deficiency, Fractures)  2. Encounter for surveillance of contraceptive pills Well on continuous low dosage birth control pill with the generic of Loestrin FE 1/20.  No contraindication to continue on birth control pills.  Prescription sent to pharmacy.  3. SUI (stress urinary incontinence, female) Mild stress urinary incontinence.  Recommend starting Kegel exercises.  Information shared with patient and pamphlet added to summary.  Will refer for physical therapy as needed in the future.  Other orders - norethindrone-ethinyl estradiol (LOESTRIN FE) 1-20 MG-MCG tablet; Take 1 tablet by mouth daily. Continuous use.  Amrita, it was a pleasure seeing you today!  I will inform you of your results as soon as they are available.   Kegel Exercises  Kegel exercises can help strengthen your pelvic floor muscles. The pelvic floor is a group of muscles that support your rectum, small intestine, and bladder. In females, pelvic floor muscles also help support the womb (uterus). These muscles help you control the flow of urine and stool. Kegel exercises are painless and simple, and they do not require any equipment. Your provider may suggest Kegel exercises to:  Improve bladder and bowel control.  Improve sexual response.  Improve weak pelvic floor muscles after surgery to remove the uterus (hysterectomy) or pregnancy (females).  Improve weak pelvic floor muscles after prostate gland removal or surgery (males). Kegel exercises involve squeezing your pelvic floor muscles, which are the same muscles you squeeze when you  try to stop the flow of urine or keep from passing gas. The exercises can be done while sitting, standing, or lying down, but it is best to vary your position. Exercises How to do Kegel exercises: 1. Squeeze your pelvic floor muscles tight. You should feel a tight lift in your rectal area. If you are a female, you should also feel a tightness in your vaginal area. Keep your stomach, buttocks, and legs relaxed. 2. Hold the muscles tight for up to 10 seconds. 3. Breathe normally. 4. Relax your muscles. 5. Repeat as told by your health care provider. Repeat this exercise daily as told by your health care provider. Continue to do this exercise for at least 4-6 weeks, or for as long as told by your health care provider. You may be referred to a physical therapist who can help you learn more about how to do Kegel exercises. Depending on your condition, your health care provider may recommend:  Varying how long you squeeze your muscles.  Doing several sets of exercises every day.  Doing exercises for several weeks.  Making Kegel exercises a part of your regular exercise routine. This information is not intended to replace advice given to you by your health care provider. Make sure you discuss any questions you have with your health care provider. Document Released: 05/30/2012 Document Revised: 01/31/2018 Document Reviewed: 01/31/2018 Elsevier Patient Education  2020 [employer].

## 2019-05-07 NOTE — Addendum Note (Signed)
Addended by: Thurnell Garbe A on: 05/07/2019 10:10 AM   Modules accepted: Orders

## 2019-05-07 NOTE — Progress Notes (Signed)
Karina Pollard February 21, 1981 [id number]   History:    38 y.o.  G2P2L2 Married.  Daughter 20, son 77 yo  RP:  Established patient presenting for annual gyn exam   HPI: Well on Generic of LoEstrin FE 1/20 continuous use for contraception and migraines.  No breakthrough bleeding.  No pelvic pain.  No pain with intercourse.  Urine normal, but mild incontinence with sneezing. Bowel movements normal.  Breasts normal. BMI 25.38.  Rt Hip surgery 4 months ago.  Stationary bike daily. Physically active playing with her kids.   Fasting health labs here today.  Past medical history,surgical history, family history and social history were all reviewed and documented in the EPIC chart.  Gynecologic History No LMP recorded. (Menstrual status: Oral contraceptives). Contraception: OCP (estrogen/progesterone) Last Pap: 07/2016. Results were: Negative/HPV HR neg Last mammogram: Never Bone Density: Never Colonoscopy: Never  Obstetric History OB History  Gravida Para Term Preterm AB Living  2 2 2  0 0 2  SAB TAB Ectopic Multiple Live Births  0 0 0 0 2    # Outcome Date GA Lbr Len/2nd Weight Sex Delivery Anes PTL Lv  2 Term 04/02/13 61w2d11:45 / 02:20 7 lb 8.6 oz (3.42 kg) M Vag-Spont EPI  LIV  1 Term     F Vag-Spont None N LIV     ROS: A ROS was performed and pertinent positives and negatives are included in the history.  GENERAL: No fevers or chills. HEENT: No change in vision, no earache, sore throat or sinus congestion. NECK: No pain or stiffness. CARDIOVASCULAR: No chest pain or pressure. No palpitations. PULMONARY: No shortness of breath, cough or wheeze. GASTROINTESTINAL: No abdominal pain, nausea, vomiting or diarrhea, melena or bright red blood per rectum. GENITOURINARY: No urinary frequency, urgency, hesitancy or dysuria. MUSCULOSKELETAL: No joint or muscle pain, no back pain, no recent trauma. DERMATOLOGIC: No rash, no itching, no lesions. ENDOCRINE: No polyuria, polydipsia, no heat or cold  intolerance. No recent change in weight. HEMATOLOGICAL: No anemia or easy bruising or bleeding. NEUROLOGIC: No headache, seizures, numbness, tingling or weakness. PSYCHIATRIC: No depression, no loss of interest in normal activity or change in sleep pattern.     Exam:   BP 120/70   Ht 5' 2.5" (1.588 m)   Wt 141 lb (64 kg)   BMI 25.38 kg/m   Body mass index is 25.38 kg/m.  General appearance : Well developed well nourished female. No acute distress HEENT: Eyes: no retinal hemorrhage or exudates,  Neck supple, trachea midline, no carotid bruits, no thyroidmegaly Lungs: Clear to auscultation, no rhonchi or wheezes, or rib retractions  Heart: Regular rate and rhythm, no murmurs or gallops Breast:Examined in sitting and supine position were symmetrical in appearance, no palpable masses or tenderness,  no skin retraction, no nipple inversion, no nipple discharge, no skin discoloration, no axillary or supraclavicular lymphadenopathy Abdomen: no palpable masses or tenderness, no rebound or guarding Extremities: no edema or skin discoloration or tenderness  Pelvic: Vulva: Normal             Vagina: No gross lesions or discharge  Cervix: No gross lesions or discharge.  Pap reflex done.  Uterus  AV, normal size, shape and consistency, non-tender and mobile  Adnexa  Without masses or tenderness  Anus: Normal   Assessment/Plan:  38y.o. female for annual exam   1. Encounter for routine gynecological examination with Papanicolaou smear of cervix Normal gynecologic exam.  Pap reflex done.  Breast exam normal.  Will start screening mammogram at age 27 next year.  Good body mass index at 25.38.  Continue with fitness and healthy nutrition.  Fasting health labs here today. - CBC - Comp Met (CMET) - TSH - Lipid panel - VITAMIN D 25 Hydroxy (Vit-D Deficiency, Fractures)  2. Encounter for surveillance of contraceptive pills Well on continuous low dosage birth control pill with the generic of  Loestrin FE 1/20.  No contraindication to continue on birth control pills.  Prescription sent to pharmacy.  3. SUI (stress urinary incontinence, female) Mild stress urinary incontinence.  Recommend starting Kegel exercises.  Information shared with patient and pamphlet added to summary.  Will refer for physical therapy as needed in the future.  Other orders - norethindrone-ethinyl estradiol (LOESTRIN FE) 1-20 MG-MCG tablet; Take 1 tablet by mouth daily. Continuous use.  Princess Bruins MD, 9:18 AM 05/07/2019

## 2019-05-08 ENCOUNTER — Encounter: Payer: 59 | Admitting: Obstetrics & Gynecology

## 2019-05-08 LAB — PAP IG W/ RFLX HPV ASCU

## 2019-05-09 LAB — COMPREHENSIVE METABOLIC PANEL
AG Ratio: 1.6 (calc) (ref 1.0–2.5)
ALT: 15 U/L (ref 6–29)
AST: 15 U/L (ref 10–30)
Albumin: 4.2 g/dL (ref 3.6–5.1)
Alkaline phosphatase (APISO): 47 U/L (ref 31–125)
BUN: 12 mg/dL (ref 7–25)
CO2: 28 mmol/L (ref 20–32)
Calcium: 9.4 mg/dL (ref 8.6–10.2)
Chloride: 103 mmol/L (ref 98–110)
Creat: 0.8 mg/dL (ref 0.50–1.10)
Globulin: 2.6 g/dL (calc) (ref 1.9–3.7)
Glucose, Bld: 88 mg/dL (ref 65–99)
Potassium: 3.8 mmol/L (ref 3.5–5.3)
Sodium: 139 mmol/L (ref 135–146)
Total Bilirubin: 0.4 mg/dL (ref 0.2–1.2)
Total Protein: 6.8 g/dL (ref 6.1–8.1)

## 2019-05-09 LAB — LIPID PANEL
Cholesterol: 214 mg/dL — ABNORMAL HIGH (ref <200)
HDL: 62 mg/dL (ref >=50)
LDL Cholesterol (Calc): 131 mg/dL (calc) — ABNORMAL HIGH
Non-HDL Cholesterol (Calc): 152 mg/dL (calc) — ABNORMAL HIGH (ref <130)
Total CHOL/HDL Ratio: 3.5 (calc) (ref <5.0)
Triglycerides: 99 mg/dL (ref <150)

## 2019-05-09 LAB — CBC
HCT: 45.5 % — ABNORMAL HIGH (ref 35.0–45.0)
Hemoglobin: 14.8 g/dL (ref 11.7–15.5)
MCH: 30.5 pg (ref 27.0–33.0)
MCHC: 32.5 g/dL (ref 32.0–36.0)
MCV: 93.8 fL (ref 80.0–100.0)
MPV: 12.8 fL — ABNORMAL HIGH (ref 7.5–12.5)
Platelets: 298 10*3/uL (ref 140–400)
RBC: 4.85 10*6/uL (ref 3.80–5.10)
RDW: 12 % (ref 11.0–15.0)
WBC: 5 10*3/uL (ref 3.8–10.8)

## 2019-05-09 LAB — VITAMIN D 25 HYDROXY (VIT D DEFICIENCY, FRACTURES): Vit D, 25-Hydroxy: 42 ng/mL (ref 30–100)

## 2019-05-09 LAB — TSH: TSH: 1.54 mIU/L

## 2019-06-13 ENCOUNTER — Other Ambulatory Visit: Payer: Self-pay | Admitting: Obstetrics & Gynecology

## 2019-06-13 DIAGNOSIS — Z1231 Encounter for screening mammogram for malignant neoplasm of breast: Secondary | ICD-10-CM

## 2019-08-01 ENCOUNTER — Ambulatory Visit: Payer: 59

## 2019-09-06 ENCOUNTER — Ambulatory Visit: Payer: 59

## 2019-10-02 ENCOUNTER — Ambulatory Visit
Admission: RE | Admit: 2019-10-02 | Discharge: 2019-10-02 | Disposition: A | Discharge status: Home / Self Care | Payer: 59 | Source: Ambulatory Visit | Attending: Obstetrics & Gynecology | Admitting: Obstetrics & Gynecology

## 2019-10-02 ENCOUNTER — Other Ambulatory Visit: Payer: Self-pay

## 2019-10-02 DIAGNOSIS — Z1231 Encounter for screening mammogram for malignant neoplasm of breast: Secondary | ICD-10-CM

## 2019-10-29 ENCOUNTER — Ambulatory Visit (HOSPITAL_COMMUNITY)
Admission: RE | Admit: 2019-10-29 | Discharge: 2019-10-29 | Disposition: A | Discharge status: Home / Self Care | Payer: 59 | Source: Ambulatory Visit | Attending: Cardiovascular Disease | Admitting: Cardiovascular Disease

## 2019-10-29 ENCOUNTER — Other Ambulatory Visit (HOSPITAL_COMMUNITY): Payer: Self-pay | Admitting: Orthopedic Surgery

## 2019-10-29 ENCOUNTER — Other Ambulatory Visit: Payer: Self-pay

## 2019-10-29 DIAGNOSIS — M79604 Pain in right leg: Secondary | ICD-10-CM | POA: Diagnosis present

## 2020-04-06 ENCOUNTER — Telehealth: Payer: Self-pay | Admitting: *Deleted

## 2020-04-06 NOTE — Telephone Encounter (Signed)
Patient is scheduled for hip replacement on 05/18/20 and was informed she will need to stop her birth control pills 2 weeks prior to surgery, patient takes pills continuously, takes to help with migraines. Patient said she is worried about stopping pills due to migraines. Asked if you know of any other options? Please advise

## 2020-04-07 NOTE — Telephone Encounter (Signed)
Patient informed. 

## 2020-04-07 NOTE — Telephone Encounter (Signed)
Unfortunately, no good replacement to offer, as alternatives would also increase the surgical risks.  Recommend condoms for contraception.  Will need to treat with "migraine medications" as prescribed by Fam MD or her surgeon.

## 2020-05-08 ENCOUNTER — Encounter: Payer: 59 | Admitting: Obstetrics & Gynecology

## 2020-07-10 ENCOUNTER — Other Ambulatory Visit: Payer: Self-pay | Admitting: Obstetrics & Gynecology

## 2020-07-10 NOTE — Telephone Encounter (Signed)
Annual exam scheduled on 08/27/20

## 2020-08-27 ENCOUNTER — Ambulatory Visit: Payer: 59 | Admitting: Obstetrics & Gynecology

## 2020-09-25 ENCOUNTER — Other Ambulatory Visit: Payer: Self-pay | Admitting: Obstetrics & Gynecology

## 2020-09-25 NOTE — Telephone Encounter (Signed)
Medication refill request: Junel continuous Last AEX:  05-07-2019 ML Next AEX: 10-29-20 Last MMG (if hormonal medication request): 10-02-19 density B/BIRADS 1 negative Refill authorized: Today, please advise.   Medication pended for #112,0RF. Please refill if appropriate.

## 2020-10-29 ENCOUNTER — Other Ambulatory Visit: Payer: Self-pay

## 2020-10-29 ENCOUNTER — Ambulatory Visit (INDEPENDENT_AMBULATORY_CARE_PROVIDER_SITE_OTHER): Payer: 59 | Admitting: Obstetrics & Gynecology

## 2020-10-29 ENCOUNTER — Encounter: Payer: Self-pay | Admitting: Obstetrics & Gynecology

## 2020-10-29 VITALS — BP 120/78 | Ht 63.0 in | Wt 147.0 lb

## 2020-10-29 DIAGNOSIS — Z3041 Encounter for surveillance of contraceptive pills: Secondary | ICD-10-CM

## 2020-10-29 DIAGNOSIS — R350 Frequency of micturition: Secondary | ICD-10-CM | POA: Diagnosis not present

## 2020-10-29 DIAGNOSIS — Z01419 Encounter for gynecological examination (general) (routine) without abnormal findings: Secondary | ICD-10-CM

## 2020-10-29 LAB — URINALYSIS, COMPLETE W/RFL CULTURE
Hyaline Cast: NONE SEEN /LPF
Ketones, ur: NEGATIVE

## 2020-10-29 LAB — CULTURE INDICATED

## 2020-10-29 MED ORDER — NORETHIN ACE-ETH ESTRAD-FE 1-20 MG-MCG PO TABS: ORAL_TABLET | ORAL | 4 refills | Status: DC

## 2020-10-29 MED ORDER — SULFAMETHOXAZOLE-TRIMETHOPRIM 800-160 MG PO TABS: 1.0000 | ORAL_TABLET | Freq: Two times a day (BID) | ORAL | 0 refills | Status: AC

## 2020-10-29 NOTE — Progress Notes (Signed)
Karina Pollard 04/21/1981 [id number]   History:    40 y.o. G2P2L2 Married. Daughter 14, son 19+ yo  [id number] presenting for annual gyn exam   LEX:NTZG on Generic of LoEstrin FE 1/20 continuous use for contraception and migraines. No breakthrough bleeding. No pelvic pain. No pain with intercourse. Urinary frequency.  Mild incontinence with sneezing. Bowel movements normal. Breasts normal. BMI 26.04. Rt Hip replacement in 04/2020.  Stationary bike daily. Physically active playing with her kids. Will f/u for fasting health labs here.  Past medical history,surgical history, family history and social history were all reviewed and documented in the EPIC chart.  Gynecologic History No LMP recorded. (Menstrual status: Oral contraceptives).  Obstetric History OB History  Gravida Para Term Preterm AB Living  2 2 2  0 0 2  SAB IAB Ectopic Multiple Live Births  0 0 0 0 2    # Outcome Date GA Lbr Len/2nd Weight Sex Delivery Anes PTL Lv  2 Term 04/02/13 26w2d11:45 / 02:20 7 lb 8.6 oz (3.42 kg) M Vag-Spont EPI  LIV  1 Term     F Vag-Spont None N LIV     ROS: A ROS was performed and pertinent positives and negatives are included in the history.  GENERAL: No fevers or chills. HEENT: No change in vision, no earache, sore throat or sinus congestion. NECK: No pain or stiffness. CARDIOVASCULAR: No chest pain or pressure. No palpitations. PULMONARY: No shortness of breath, cough or wheeze. GASTROINTESTINAL: No abdominal pain, nausea, vomiting or diarrhea, melena or bright red blood per rectum. GENITOURINARY: No urinary frequency, urgency, hesitancy or dysuria. MUSCULOSKELETAL: No joint or muscle pain, no back pain, no recent trauma. DERMATOLOGIC: No rash, no itching, no lesions. ENDOCRINE: No polyuria, polydipsia, no heat or cold intolerance. No recent change in weight. HEMATOLOGICAL: No anemia or easy bruising or bleeding. NEUROLOGIC: No headache, seizures, numbness,  tingling or weakness. PSYCHIATRIC: No depression, no loss of interest in normal activity or change in sleep pattern.     Exam:   BP 120/78   Ht 5' 3"  (1.6 m)   Wt 147 lb (66.7 kg)   BMI 26.04 kg/m   Body mass index is 26.04 kg/m.  General appearance : Well developed well nourished female. No acute distress HEENT: Eyes: no retinal hemorrhage or exudates,  Neck supple, trachea midline, no carotid bruits, no thyroidmegaly Lungs: Clear to auscultation, no rhonchi or wheezes, or rib retractions  Heart: Regular rate and rhythm, no murmurs or gallops Breast:Examined in sitting and supine position were symmetrical in appearance, no palpable masses or tenderness,  no skin retraction, no nipple inversion, no nipple discharge, no skin discoloration, no axillary or supraclavicular lymphadenopathy Abdomen: no palpable masses or tenderness, no rebound or guarding Extremities: no edema or skin discoloration or tenderness  Pelvic: Vulva: Normal             Vagina: No gross lesions or discharge  Cervix: No gross lesions or discharge  Uterus  AV, normal size, shape and consistency, non-tender and mobile  Adnexa  Without masses or tenderness  Anus: Normal  U/A: Yellow cloudy, protein negative, nitrite negative, white blood cells 40-60, red blood cells 20-40, bacteria many.  Urine culture pending.   Assessment/Plan:  40y.o. female for annual exam   1. Well female exam with routine gynecological exam Normal gynecologic exam.  Pap test negative in 2020, no indication to repeat this year.  Breast exam normal.  Will schedule a screening mammogram now.  Body  mass index 26.04.  Continue with fitness and healthy nutrition.  Follow-up here for fasting health labs. - CBC; Future - Comp Met (CMET); Future - TSH; Future - Lipid Profile; Future - Vitamin D 1,25 dihydroxy; Future  2. Encounter for surveillance of contraceptive pills Well on the generic of Loestrin FE 1/20 continuously for migraines and  contraception.  No contraindication to continue.  Prescription sent to pharmacy.  3. Frequency of urination Probable acute cystitis.  Will treat with Bactrim DS 1 tablet twice a day for 3 days.  Usage reviewed.  Recommend drinking plenty of water.  Pending urine culture. - Urinalysis,Complete w/RFL Culture  Other orders - norethindrone-ethinyl estradiol (JUNEL FE 1/20) 1-20 MG-MCG tablet; TAKE 1 TABLET BY MOUTH DAILY. CONTINUOUS USE. - sulfamethoxazole-trimethoprim (BACTRIM DS) 800-160 MG tablet; Take 1 tablet by mouth 2 (two) times daily for 3 days.  Princess Bruins MD, 9:09 AM 10/29/2020

## 2020-11-01 LAB — URINALYSIS, COMPLETE W/RFL CULTURE
Bilirubin Urine: NEGATIVE
Glucose, UA: NEGATIVE
Nitrites, Initial: NEGATIVE
Protein, ur: NEGATIVE
Specific Gravity, Urine: 1.02 (ref 1.001–1.035)
pH: 7 (ref 5.0–8.0)

## 2020-11-01 LAB — URINE CULTURE
MICRO NUMBER:: 11854574
SPECIMEN QUALITY:: ADEQUATE

## 2021-03-25 ENCOUNTER — Other Ambulatory Visit: Payer: Self-pay | Admitting: Obstetrics & Gynecology

## 2021-03-25 DIAGNOSIS — Z1231 Encounter for screening mammogram for malignant neoplasm of breast: Secondary | ICD-10-CM

## 2021-04-26 ENCOUNTER — Other Ambulatory Visit: Payer: Self-pay

## 2021-04-26 ENCOUNTER — Ambulatory Visit
Admission: RE | Admit: 2021-04-26 | Discharge: 2021-04-26 | Disposition: A | Discharge status: Home / Self Care | Payer: 59 | Source: Ambulatory Visit | Attending: Obstetrics & Gynecology | Admitting: Obstetrics & Gynecology

## 2021-04-26 DIAGNOSIS — Z1231 Encounter for screening mammogram for malignant neoplasm of breast: Secondary | ICD-10-CM

## 2021-11-03 ENCOUNTER — Encounter: Payer: Self-pay | Admitting: Obstetrics & Gynecology

## 2021-11-03 ENCOUNTER — Ambulatory Visit (INDEPENDENT_AMBULATORY_CARE_PROVIDER_SITE_OTHER): Payer: 59 | Admitting: Obstetrics & Gynecology

## 2021-11-03 ENCOUNTER — Other Ambulatory Visit (HOSPITAL_COMMUNITY)
Admission: RE | Admit: 2021-11-03 | Discharge: 2021-11-03 | Disposition: A | Discharge status: Home / Self Care | Payer: 59 | Source: Ambulatory Visit | Attending: Obstetrics & Gynecology | Admitting: Obstetrics & Gynecology

## 2021-11-03 VITALS — BP 118/74 | HR 70 | Resp 16 | Ht 62.75 in | Wt 154.0 lb

## 2021-11-03 DIAGNOSIS — Z01419 Encounter for gynecological examination (general) (routine) without abnormal findings: Secondary | ICD-10-CM

## 2021-11-03 DIAGNOSIS — Z3041 Encounter for surveillance of contraceptive pills: Secondary | ICD-10-CM | POA: Diagnosis not present

## 2021-11-03 MED ORDER — NORETHIN ACE-ETH ESTRAD-FE 1-20 MG-MCG PO TABS: ORAL_TABLET | ORAL | 4 refills | Status: DC

## 2021-11-03 NOTE — Progress Notes (Signed)
? ? ?Karina Pollard 1981-03-20 [id number] ? ? ?History:    41 y.o. . G2P2L2 Married.  Daughter 72, son 8+ yo ?  ?RP:  Established patient presenting for annual gyn exam  ?  ?HPI: Well on Generic of LoEstrin FE 1/20 continuous use for contraception and migraines.  No breakthrough bleeding.  No pelvic pain.  No pain with intercourse. No h/o abnormal Pap.  Pap Neg 04/2019.  Pap reflex today.  Urine/Bowel movements normal.  Breasts normal. Mammo Neg 03/2021.  BMI 27.5.  Rt Hip replacement in 04/2020.  Stationary bike daily. Physically active playing with her kids.   Fasting health labs here today. ? ?Past medical history,surgical history, family history and social history were all reviewed and documented in the EPIC chart. ? ?Gynecologic History ?No LMP recorded. (Menstrual status: Oral contraceptives). ? ?Obstetric History ?OB History  ?Gravida Para Term Preterm AB Living  ?2 2 2  0 0 2  ?SAB IAB Ectopic Multiple Live Births  ?0 0 0 0 2  ?  ?# Outcome Date GA Lbr Len/2nd Weight Sex Delivery Anes PTL Lv  ?2 Term 04/02/13 46w2d11:45 / 02:20 7 lb 8.6 oz (3.42 kg) M Vag-Spont EPI  LIV  ?1 Term     F Vag-Spont None N LIV  ? ? ? ?ROS: A ROS was performed and pertinent positives and negatives are included in the history. ?GENERAL: No fevers or chills. HEENT: No change in vision, no earache, sore throat or sinus congestion. NECK: No pain or stiffness. CARDIOVASCULAR: No chest pain or pressure. No palpitations. PULMONARY: No shortness of breath, cough or wheeze. GASTROINTESTINAL: No abdominal pain, nausea, vomiting or diarrhea, melena or bright red blood per rectum. GENITOURINARY: No urinary frequency, urgency, hesitancy or dysuria. MUSCULOSKELETAL: No joint or muscle pain, no back pain, no recent trauma. DERMATOLOGIC: No rash, no itching, no lesions. ENDOCRINE: No polyuria, polydipsia, no heat or cold intolerance. No recent change in weight. HEMATOLOGICAL: No anemia or easy bruising or bleeding. NEUROLOGIC: No headache,  seizures, numbness, tingling or weakness. PSYCHIATRIC: No depression, no loss of interest in normal activity or change in sleep pattern.  ?  ? ?Exam: ? ? ?BP 118/74   Pulse 70   Resp 16   Ht 5' 2.75" (1.594 m)   Wt 154 lb (69.9 kg)   BMI 27.50 kg/m?  ? ?Body mass index is 27.5 kg/m?. ? ?General appearance : Well developed well nourished female. No acute distress ?HEENT: Eyes: no retinal hemorrhage or exudates,  Neck supple, trachea midline, no carotid bruits, no thyroidmegaly ?Lungs: Clear to auscultation, no rhonchi or wheezes, or rib retractions  ?Heart: Regular rate and rhythm, no murmurs or gallops ?Breast:Examined in sitting and supine position were symmetrical in appearance, no palpable masses or tenderness,  no skin retraction, no nipple inversion, no nipple discharge, no skin discoloration, no axillary or supraclavicular lymphadenopathy ?Abdomen: no palpable masses or tenderness, no rebound or guarding ?Extremities: no edema or skin discoloration or tenderness ? ?Pelvic: Vulva: Normal ?            Vagina: No gross lesions or discharge ? Cervix: No gross lesions or discharge.  Pap reflex done. ? Uterus  AV, normal size, shape and consistency, non-tender and mobile ? Adnexa  Without masses or tenderness ? Anus: Normal ? ? ?Assessment/Plan:  41y.o. female for annual exam  ? ?1. Encounter for routine gynecological examination with Papanicolaou smear of cervix ?Well on Generic of LoEstrin FE 1/20 continuous use for contraception and migraines.  No breakthrough bleeding.  No pelvic pain.  No pain with intercourse. No h/o abnormal Pap.  Pap Neg 04/2019.  Pap reflex today.  Urine/Bowel movements normal.  Breasts normal. Mammo Neg 03/2021.  BMI 27.5.  Rt Hip replacement in 04/2020.  Stationary bike daily. Physically active playing with her kids.   Fasting health labs here today. ?- CBC ?- Comp Met (CMET) ?- Lipid Profile ?- TSH ?- Vitamin D (25 hydroxy) ?- Cytology - PAP ? ?2. Encounter for surveillance of  contraceptive pills ?Well on continuous Junel Fe 1/20.  No CI to continue.  Prescription sent to pharmacy. ? ?Other orders ?- norethindrone-ethinyl estradiol-FE (JUNEL FE 1/20) 1-20 MG-MCG tablet; TAKE 1 TABLET BY MOUTH DAILY. CONTINUOUS USE.  ? ?Princess Bruins MD, 9:10 AM 11/03/2021 ? ?  ?

## 2021-11-04 LAB — COMPREHENSIVE METABOLIC PANEL
AG Ratio: 1.4 (calc) (ref 1.0–2.5)
ALT: 14 U/L (ref 6–29)
AST: 14 U/L (ref 10–30)
Albumin: 4.3 g/dL (ref 3.6–5.1)
Alkaline phosphatase (APISO): 55 U/L (ref 31–125)
BUN: 11 mg/dL (ref 7–25)
CO2: 26 mmol/L (ref 20–32)
Calcium: 9.6 mg/dL (ref 8.6–10.2)
Chloride: 107 mmol/L (ref 98–110)
Creat: 0.85 mg/dL (ref 0.50–0.99)
Globulin: 3 g/dL (calc) (ref 1.9–3.7)
Glucose, Bld: 90 mg/dL (ref 65–99)
Potassium: 4.3 mmol/L (ref 3.5–5.3)
Sodium: 146 mmol/L (ref 135–146)
Total Bilirubin: 0.4 mg/dL (ref 0.2–1.2)
Total Protein: 7.3 g/dL (ref 6.1–8.1)

## 2021-11-04 LAB — LIPID PANEL
Cholesterol: 237 mg/dL — ABNORMAL HIGH (ref <200)
HDL: 71 mg/dL (ref >=50)
LDL Cholesterol (Calc): 146 mg/dL (calc) — ABNORMAL HIGH
Non-HDL Cholesterol (Calc): 166 mg/dL (calc) — ABNORMAL HIGH (ref <130)
Total CHOL/HDL Ratio: 3.3 (calc) (ref <5.0)
Triglycerides: 94 mg/dL (ref <150)

## 2021-11-04 LAB — CBC
HCT: 44 % (ref 35.0–45.0)
Hemoglobin: 14.9 g/dL (ref 11.7–15.5)
MCH: 31.5 pg (ref 27.0–33.0)
MCHC: 33.9 g/dL (ref 32.0–36.0)
MCV: 93 fL (ref 80.0–100.0)
MPV: 10 fL (ref 7.5–12.5)
Platelets: 328 10*3/uL (ref 140–400)
RBC: 4.73 10*6/uL (ref 3.80–5.10)
RDW: 11.8 % (ref 11.0–15.0)
WBC: 5.1 10*3/uL (ref 3.8–10.8)

## 2021-11-04 LAB — TSH: TSH: 1.13 mIU/L

## 2021-11-04 LAB — VITAMIN D 25 HYDROXY (VIT D DEFICIENCY, FRACTURES): Vit D, 25-Hydroxy: 43 ng/mL (ref 30–100)

## 2021-11-09 LAB — CYTOLOGY - PAP
Comment: NEGATIVE
Diagnosis: UNDETERMINED — AB
High risk HPV: NEGATIVE

## 2022-04-07 ENCOUNTER — Other Ambulatory Visit: Payer: Self-pay | Admitting: Obstetrics & Gynecology

## 2022-04-07 DIAGNOSIS — Z1231 Encounter for screening mammogram for malignant neoplasm of breast: Secondary | ICD-10-CM

## 2022-05-18 ENCOUNTER — Ambulatory Visit
Admission: RE | Admit: 2022-05-18 | Discharge: 2022-05-18 | Disposition: A | Discharge status: Home / Self Care | Payer: 59 | Source: Ambulatory Visit | Attending: Obstetrics & Gynecology | Admitting: Obstetrics & Gynecology

## 2022-05-18 DIAGNOSIS — Z1231 Encounter for screening mammogram for malignant neoplasm of breast: Secondary | ICD-10-CM

## 2022-08-21 IMAGING — MG MM DIGITAL SCREENING BILAT W/ TOMO AND CAD
8 series · 8 of 24 positions shown · non-contrast
Comparison: Previous exam(s).

CLINICAL DATA: Screening.

EXAM:
DIGITAL SCREENING BILATERAL MAMMOGRAM WITH TOMOSYNTHESIS AND CAD
TECHNIQUE: Bilateral screening digital craniocaudal and mediolateral oblique
mammograms were obtained. Bilateral screening digital breast
tomosynthesis was performed. The images were evaluated with
computer-aided detection.

[L MLO synth-2D]
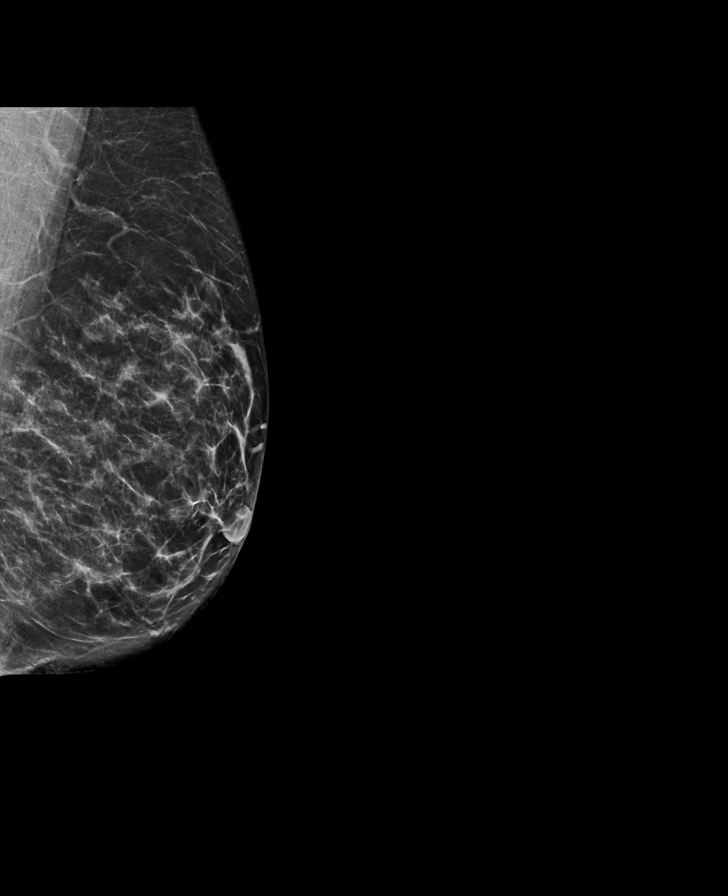

[R CC synth-2D]
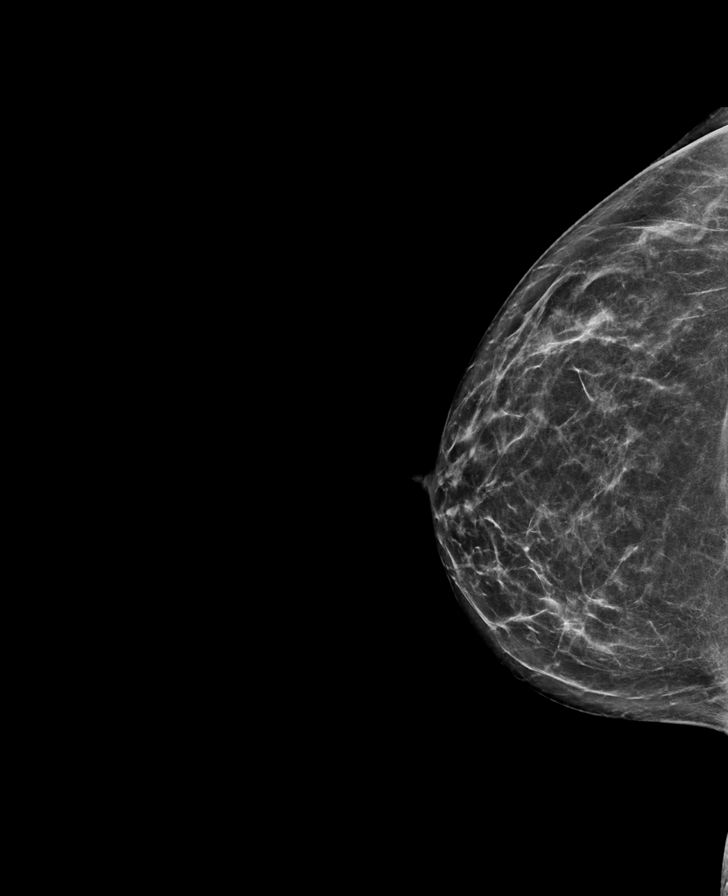

[L CC synth-2D]
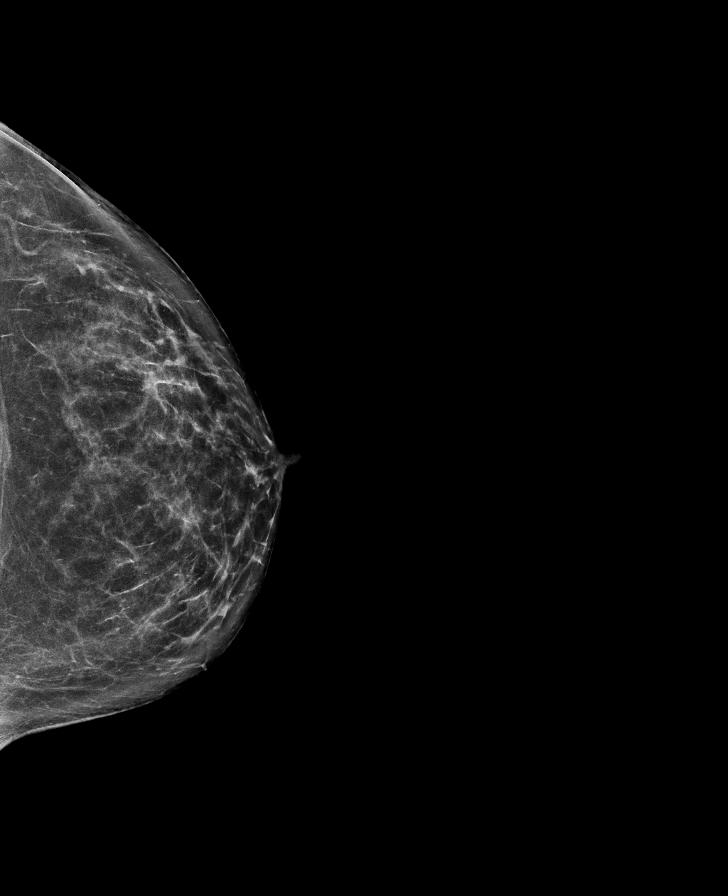

[R MLO synth-2D]
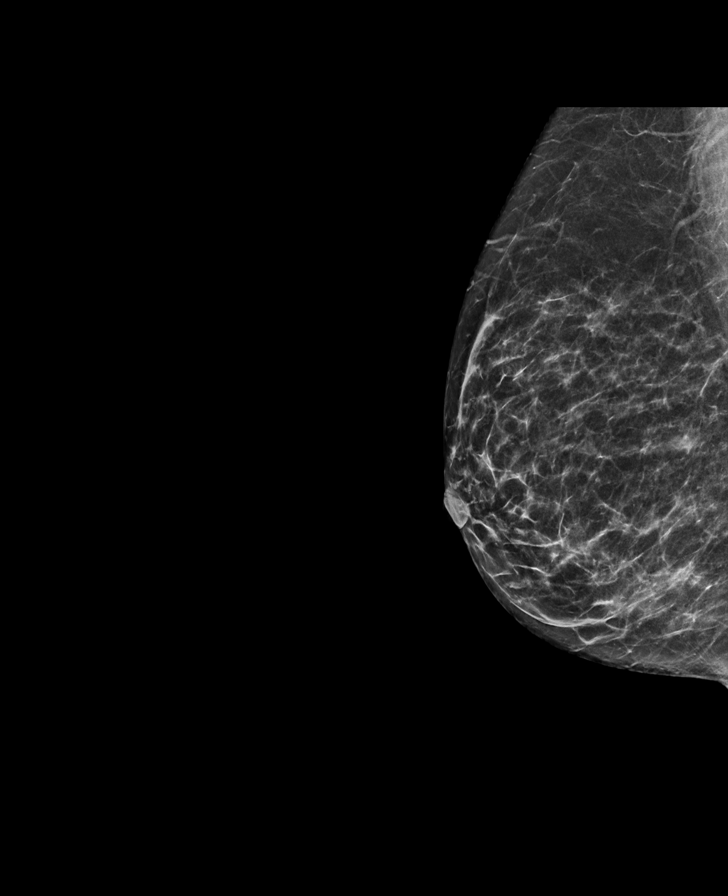

[L MLO tomo · tomo slice 34/67.0]
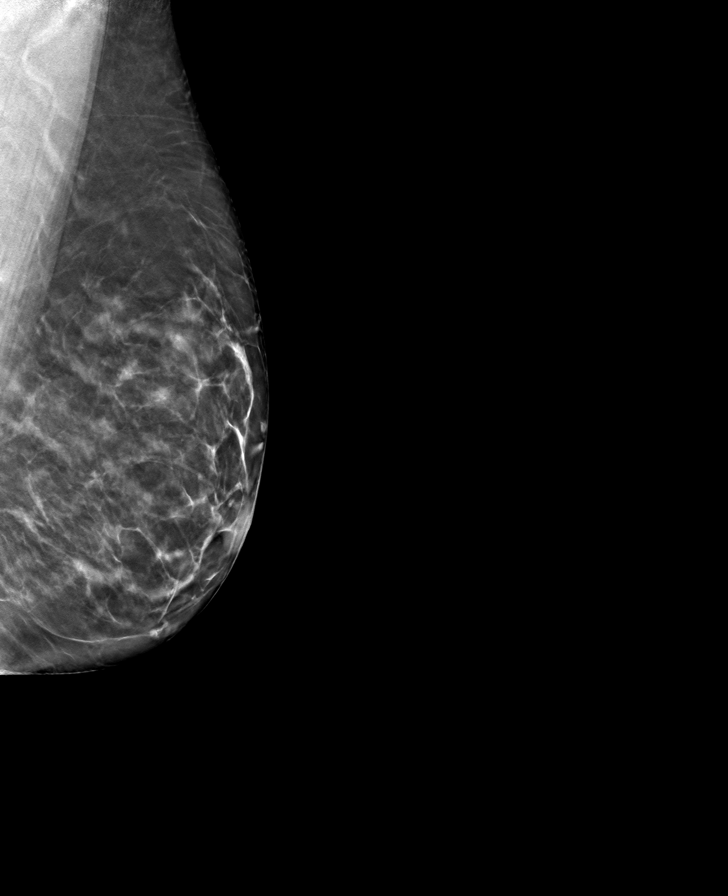

[R CC tomo · tomo slice 37/72.0]
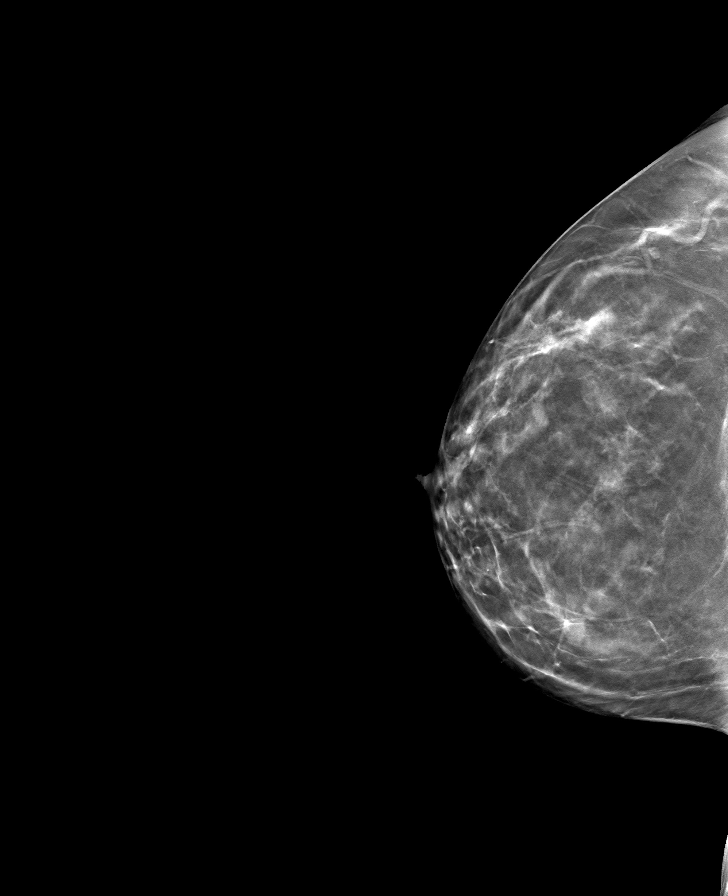

[L CC tomo · tomo slice 36/71.0]
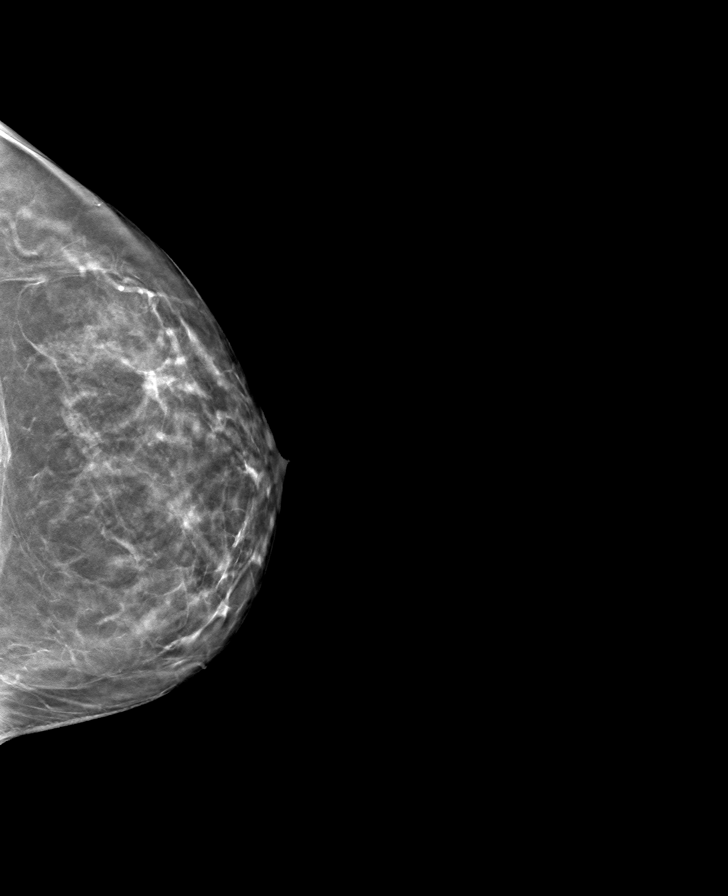

[R MLO tomo · tomo slice 31/61.0]
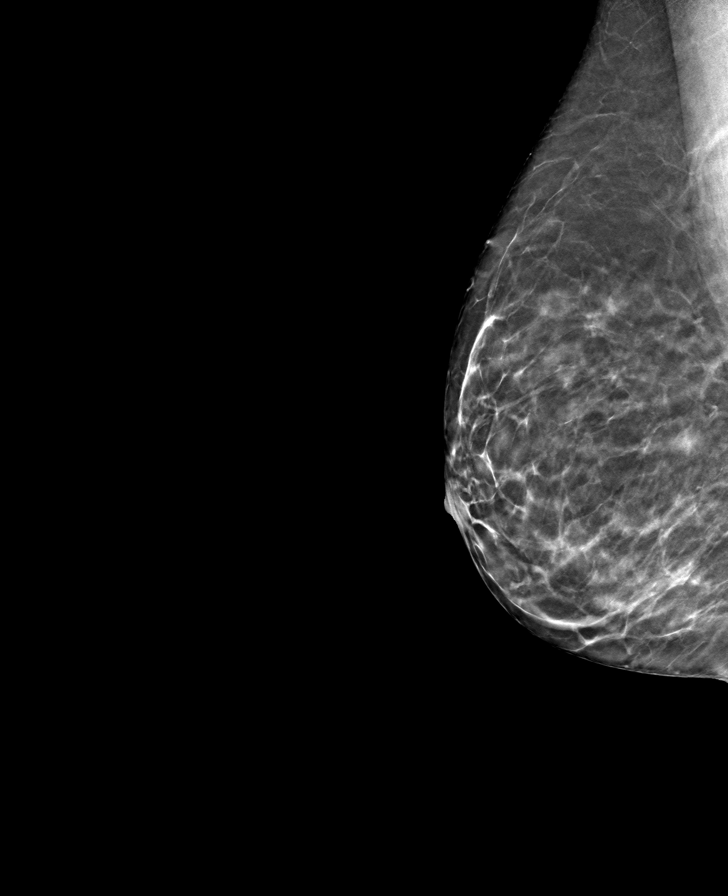

[8 of 24 positions shown; findings below may reference images not displayed]

ACR Breast Density Category b: There are scattered areas of
fibroglandular density.
FINDINGS: There are no findings suspicious for malignancy.
IMPRESSION: No mammographic evidence of malignancy. A result letter of this
screening mammogram will be mailed directly to the patient.

RECOMMENDATION:
Screening mammogram in one year. (Code:51-O-LD2)

BI-RADS CATEGORY  1: Negative.

## 2022-11-29 ENCOUNTER — Encounter: Payer: Self-pay | Admitting: Obstetrics & Gynecology

## 2022-11-29 ENCOUNTER — Ambulatory Visit (INDEPENDENT_AMBULATORY_CARE_PROVIDER_SITE_OTHER): Payer: 59 | Admitting: Obstetrics & Gynecology

## 2022-11-29 VITALS — BP 118/78 | HR 103 | Ht 62.75 in | Wt 165.0 lb

## 2022-11-29 DIAGNOSIS — Z01419 Encounter for gynecological examination (general) (routine) without abnormal findings: Secondary | ICD-10-CM | POA: Diagnosis not present

## 2022-11-29 DIAGNOSIS — Z3041 Encounter for surveillance of contraceptive pills: Secondary | ICD-10-CM | POA: Diagnosis not present

## 2022-11-29 MED ORDER — NORETHIN ACE-ETH ESTRAD-FE 1-20 MG-MCG PO TABS: ORAL_TABLET | ORAL | 4 refills | Status: DC

## 2022-11-29 NOTE — Progress Notes (Signed)
Karina Pollard [birth year] [id number]   History:    42 y.o. G2P2L2 Married.  Daughter 27, son 44+ yo   RP:  Established patient presenting for annual gyn exam    HPI: Well on Generic of LoEstrin FE 1/20 continuous use for contraception and migraines.  No breakthrough bleeding.  No pelvic pain.  No pain with intercourse. No h/o abnormal Pap.  Pap Neg 10/2021. Repeat Pap at 3 years. Urine/Bowel movements normal.  Breasts normal. Mammo Neg 04/2022.  BMI 29.46.  Rt Hip replacement in 04/2020.  Stationary bike daily.  Fasting health labs here today.   Past medical history,surgical history, family history and social history were all reviewed and documented in the EPIC chart.  Gynecologic History No LMP recorded. (Menstrual status: Oral contraceptives).  Obstetric History OB History  Gravida Para Term Preterm AB Living  2 2 2  0 0 2  SAB IAB Ectopic Multiple Live Births  0 0 0 0 2    # Outcome Date GA Lbr Len/2nd Weight Sex Delivery Anes PTL Lv  2 Term 04/02/13 [redacted]w[redacted]d 11:45 / 02:20 7 lb 8.6 oz (3.42 kg) M Vag-Spont EPI  LIV  1 Term     F Vag-Spont None N LIV     ROS: A ROS was performed and pertinent positives and negatives are included in the history. GENERAL: No fevers or chills. HEENT: No change in vision, no earache, sore throat or sinus congestion. NECK: No pain or stiffness. CARDIOVASCULAR: No chest pain or pressure. No palpitations. PULMONARY: No shortness of breath, cough or wheeze. GASTROINTESTINAL: No abdominal pain, nausea, vomiting or diarrhea, melena or bright red blood per rectum. GENITOURINARY: No urinary frequency, urgency, hesitancy or dysuria. MUSCULOSKELETAL: No joint or muscle pain, no back pain, no recent trauma. DERMATOLOGIC: No rash, no itching, no lesions. ENDOCRINE: No polyuria, polydipsia, no heat or cold intolerance. No recent change in weight. HEMATOLOGICAL: No anemia or easy bruising or bleeding. NEUROLOGIC: No headache, seizures, numbness, tingling or weakness.  PSYCHIATRIC: No depression, no loss of interest in normal activity or change in sleep pattern.     Exam:   BP 118/78   Pulse (!) 103   Ht 5' 2.75" (1.594 m)   Wt 165 lb (74.8 kg)   SpO2 98%   BMI 29.46 kg/m   Body mass index is 29.46 kg/m.  General appearance : Well developed well nourished female. No acute distress HEENT: Eyes: no retinal hemorrhage or exudates,  Neck supple, trachea midline, no carotid bruits, no thyroidmegaly Lungs: Clear to auscultation, no rhonchi or wheezes, or rib retractions  Heart: Regular rate and rhythm, no murmurs or gallops Breast:Examined in sitting and supine position were symmetrical in appearance, no palpable masses or tenderness,  no skin retraction, no nipple inversion, no nipple discharge, no skin discoloration, no axillary or supraclavicular lymphadenopathy Abdomen: no palpable masses or tenderness, no rebound or guarding Extremities: no edema or skin discoloration or tenderness  Pelvic: Vulva: Normal             Vagina: No gross lesions or discharge  Cervix: No gross lesions or discharge  Uterus  AV, normal size, shape and consistency, non-tender and mobile  Adnexa  Without masses or tenderness  Anus: Normal   Assessment/Plan:  42 y.o. female for annual exam   1. Well female exam with routine gynecological exam Well on Generic of LoEstrin FE 1/20 continuous use for contraception and migraines.  No breakthrough bleeding.  No pelvic pain.  No pain with intercourse. No  h/o abnormal Pap.  Pap Neg 10/2021. Repeat Pap at 3 years. Urine/Bowel movements normal.  Breasts normal. Mammo Neg 04/2022.  BMI 29.46.  Rt Hip replacement in 04/2020.  Stationary bike daily.  Fasting health labs here today. - CBC - Comp Met (CMET) - TSH - Lipid Profile - Vitamin D (25 hydroxy)  2. Encounter for surveillance of contraceptive pills Well on Generic of LoEstrin FE 1/20 continuous use for contraception and migraines.  No breakthrough bleeding.  No pelvic  pain.  No pain with intercourse. No CI to continue. Prescription sent to pharmacy.  Other orders - norethindrone-ethinyl estradiol-FE (JUNEL FE 1/20) 1-20 MG-MCG tablet; TAKE 1 TABLET BY MOUTH DAILY. CONTINUOUS USE.   Genia Del MD, 10:37 AM

## 2022-11-30 LAB — CBC
HCT: 43.8 % (ref 35.0–45.0)
Hemoglobin: 14.8 g/dL (ref 11.7–15.5)
MCH: 31 pg (ref 27.0–33.0)
MCHC: 33.8 g/dL (ref 32.0–36.0)
MCV: 91.8 fL (ref 80.0–100.0)
MPV: 10.1 fL (ref 7.5–12.5)
Platelets: 325 10*3/uL (ref 140–400)
RBC: 4.77 10*6/uL (ref 3.80–5.10)
RDW: 12 % (ref 11.0–15.0)
WBC: 5.7 10*3/uL (ref 3.8–10.8)

## 2022-11-30 LAB — LIPID PANEL
Cholesterol: 214 mg/dL — ABNORMAL HIGH (ref <200)
HDL: 67 mg/dL (ref >=50)
LDL Cholesterol (Calc): 123 mg/dL (calc) — ABNORMAL HIGH
Non-HDL Cholesterol (Calc): 147 mg/dL (calc) — ABNORMAL HIGH (ref <130)
Total CHOL/HDL Ratio: 3.2 (calc) (ref <5.0)
Triglycerides: 128 mg/dL (ref <150)

## 2022-11-30 LAB — COMPREHENSIVE METABOLIC PANEL
AG Ratio: 1.6 (calc) (ref 1.0–2.5)
ALT: 13 U/L (ref 6–29)
AST: 14 U/L (ref 10–30)
Albumin: 4.5 g/dL (ref 3.6–5.1)
Alkaline phosphatase (APISO): 60 U/L (ref 31–125)
BUN: 13 mg/dL (ref 7–25)
CO2: 22 mmol/L (ref 20–32)
Calcium: 9.3 mg/dL (ref 8.6–10.2)
Chloride: 105 mmol/L (ref 98–110)
Creat: 0.89 mg/dL (ref 0.50–0.99)
Globulin: 2.8 g/dL (calc) (ref 1.9–3.7)
Glucose, Bld: 84 mg/dL (ref 65–99)
Potassium: 3.8 mmol/L (ref 3.5–5.3)
Sodium: 139 mmol/L (ref 135–146)
Total Bilirubin: 0.4 mg/dL (ref 0.2–1.2)
Total Protein: 7.3 g/dL (ref 6.1–8.1)

## 2022-11-30 LAB — VITAMIN D 25 HYDROXY (VIT D DEFICIENCY, FRACTURES): Vit D, 25-Hydroxy: 47 ng/mL (ref 30–100)

## 2022-11-30 LAB — TSH: TSH: 1.24 mIU/L

## 2023-12-08 ENCOUNTER — Ambulatory Visit (INDEPENDENT_AMBULATORY_CARE_PROVIDER_SITE_OTHER): Payer: Self-pay | Admitting: Obstetrics and Gynecology

## 2023-12-08 ENCOUNTER — Encounter: Payer: Self-pay | Admitting: Obstetrics and Gynecology

## 2023-12-08 VITALS — BP 118/78 | HR 89 | Temp 98.4°F | Ht 64.0 in | Wt 165.0 lb

## 2023-12-08 DIAGNOSIS — Z1331 Encounter for screening for depression: Secondary | ICD-10-CM

## 2023-12-08 DIAGNOSIS — Z01419 Encounter for gynecological examination (general) (routine) without abnormal findings: Secondary | ICD-10-CM | POA: Diagnosis not present

## 2023-12-08 DIAGNOSIS — Z3041 Encounter for surveillance of contraceptive pills: Secondary | ICD-10-CM | POA: Insufficient documentation

## 2023-12-08 MED ORDER — NORETHIN ACE-ETH ESTRAD-FE 1-20 MG-MCG PO TABS: ORAL_TABLET | ORAL | 4 refills | Status: AC
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
## 2023-12-08 NOTE — Assessment & Plan Note (Signed)
 Cervical cancer screening performed according to ASCCP guidelines. Encouraged annual mammogram screening Colonoscopy due next year  Labs and immunizations with her primary Encouraged safe sexual practices as indicated Encouraged healthy lifestyle practices with diet and exercise For patients under 45yo, I recommend 1000mg  calcium daily and 600IU of vitamin D  daily.

## 2023-12-08 NOTE — Patient Instructions (Signed)

## 2023-12-09 LAB — THYROID PANEL WITH TSH
Free Thyroxine Index: 2.3 (ref 1.4–3.8)
T3 Uptake: 21 % — ABNORMAL LOW (ref 22–35)
T4, Total: 10.8 ug/dL (ref 5.1–11.9)
TSH: 1.27 m[IU]/L

## 2023-12-09 LAB — COMPLETE METABOLIC PANEL WITHOUT GFR
AG Ratio: 1.6 (calc) (ref 1.0–2.5)
ALT: 14 U/L (ref 6–29)
AST: 14 U/L (ref 10–30)
Albumin: 4.3 g/dL (ref 3.6–5.1)
Alkaline phosphatase (APISO): 54 U/L (ref 31–125)
BUN: 11 mg/dL (ref 7–25)
CO2: 24 mmol/L (ref 20–32)
Calcium: 9.1 mg/dL (ref 8.6–10.2)
Chloride: 106 mmol/L (ref 98–110)
Creat: 0.8 mg/dL (ref 0.50–0.99)
Globulin: 2.7 g/dL (ref 1.9–3.7)
Glucose, Bld: 95 mg/dL (ref 65–99)
Potassium: 4 mmol/L (ref 3.5–5.3)
Sodium: 141 mmol/L (ref 135–146)
Total Bilirubin: 0.3 mg/dL (ref 0.2–1.2)
Total Protein: 7 g/dL (ref 6.1–8.1)

## 2023-12-09 LAB — LIPID PANEL
Cholesterol: 203 mg/dL — ABNORMAL HIGH (ref <200)
HDL: 54 mg/dL (ref >=50)
LDL Cholesterol (Calc): 124 mg/dL — ABNORMAL HIGH
Non-HDL Cholesterol (Calc): 149 mg/dL — ABNORMAL HIGH (ref <130)
Total CHOL/HDL Ratio: 3.8 (calc) (ref <5.0)
Triglycerides: 136 mg/dL (ref <150)

## 2023-12-09 LAB — CBC
HCT: 44.4 % (ref 35.0–45.0)
Hemoglobin: 14.3 g/dL (ref 11.7–15.5)
MCH: 30.6 pg (ref 27.0–33.0)
MCHC: 32.2 g/dL (ref 32.0–36.0)
MCV: 94.9 fL (ref 80.0–100.0)
MPV: 10 fL (ref 7.5–12.5)
Platelets: 352 Thousand/uL (ref 140–400)
RBC: 4.68 Million/uL (ref 3.80–5.10)
RDW: 12.2 % (ref 11.0–15.0)
WBC: 6.1 Thousand/uL (ref 3.8–10.8)

## 2023-12-09 LAB — VITAMIN D 25 HYDROXY (VIT D DEFICIENCY, FRACTURES): Vit D, 25-Hydroxy: 57 ng/mL (ref 30–100)

## 2023-12-09 LAB — HEMOGLOBIN A1C W/OUT EAG: Hgb A1c MFr Bld: 5.4 % (ref <5.7)

## 2023-12-12 ENCOUNTER — Ambulatory Visit: Payer: Self-pay | Admitting: Obstetrics and Gynecology
